# Patient Record
Sex: Female | Born: 1970
Health system: Southern US, Community
[De-identification: ages and names within clinical notes are randomized; demographics above are authoritative.]

## PROBLEM LIST (undated history)

## (undated) DIAGNOSIS — E079 Disorder of thyroid, unspecified: Secondary | ICD-10-CM

## (undated) DIAGNOSIS — F419 Anxiety disorder, unspecified: Secondary | ICD-10-CM

## (undated) DIAGNOSIS — D219 Benign neoplasm of connective and other soft tissue, unspecified: Secondary | ICD-10-CM

## (undated) DIAGNOSIS — F32A Depression, unspecified: Secondary | ICD-10-CM

## (undated) DIAGNOSIS — C801 Malignant (primary) neoplasm, unspecified: Secondary | ICD-10-CM

## (undated) HISTORY — DX: Disorder of thyroid, unspecified: E07.9

## (undated) HISTORY — PX: MOUTH SURGERY: SHX715

## (undated) HISTORY — DX: Anxiety disorder, unspecified: F41.9

## (undated) HISTORY — DX: Depression, unspecified: F32.A

## (undated) HISTORY — DX: Benign neoplasm of connective and other soft tissue, unspecified: D21.9

---

## 2007-04-11 ENCOUNTER — Inpatient Hospital Stay (HOSPITAL_COMMUNITY): Admission: AD | Admit: 2007-04-11 | Discharge: 2007-04-12 | Payer: Self-pay | Admitting: Obstetrics and Gynecology

## 2007-07-25 ENCOUNTER — Ambulatory Visit (HOSPITAL_COMMUNITY): Admission: RE | Admit: 2007-07-25 | Discharge: 2007-07-25 | Payer: Self-pay | Admitting: *Deleted

## 2007-07-25 ENCOUNTER — Encounter (INDEPENDENT_AMBULATORY_CARE_PROVIDER_SITE_OTHER): Payer: Self-pay | Admitting: *Deleted

## 2008-02-17 ENCOUNTER — Emergency Department (HOSPITAL_COMMUNITY): Admission: EM | Admit: 2008-02-17 | Discharge: 2008-02-17 | Payer: Self-pay | Admitting: Emergency Medicine

## 2008-11-27 ENCOUNTER — Telehealth: Payer: Self-pay | Admitting: *Deleted

## 2010-11-23 ENCOUNTER — Other Ambulatory Visit (HOSPITAL_COMMUNITY): Payer: Self-pay | Admitting: Obstetrics and Gynecology

## 2010-11-23 DIAGNOSIS — Z0000 Encounter for general adult medical examination without abnormal findings: Secondary | ICD-10-CM

## 2010-11-30 ENCOUNTER — Ambulatory Visit (HOSPITAL_COMMUNITY)
Admission: RE | Admit: 2010-11-30 | Discharge: 2010-11-30 | Disposition: A | Discharge status: Home / Self Care | Payer: 59 | Source: Ambulatory Visit | Attending: Obstetrics and Gynecology | Admitting: Obstetrics and Gynecology

## 2010-11-30 DIAGNOSIS — Z0000 Encounter for general adult medical examination without abnormal findings: Secondary | ICD-10-CM

## 2010-11-30 DIAGNOSIS — Z1231 Encounter for screening mammogram for malignant neoplasm of breast: Secondary | ICD-10-CM | POA: Insufficient documentation

## 2010-12-05 ENCOUNTER — Other Ambulatory Visit: Payer: Self-pay | Admitting: Obstetrics and Gynecology

## 2010-12-05 DIAGNOSIS — R928 Other abnormal and inconclusive findings on diagnostic imaging of breast: Secondary | ICD-10-CM

## 2010-12-09 ENCOUNTER — Ambulatory Visit
Admission: RE | Admit: 2010-12-09 | Discharge: 2010-12-09 | Disposition: A | Discharge status: Home / Self Care | Payer: 59 | Source: Ambulatory Visit | Attending: Obstetrics and Gynecology | Admitting: Obstetrics and Gynecology

## 2010-12-09 DIAGNOSIS — R928 Other abnormal and inconclusive findings on diagnostic imaging of breast: Secondary | ICD-10-CM

## 2011-03-07 NOTE — Op Note (Signed)
Lisa Morrow, RIEF NO.:  [id number]   MEDICAL RECORD NO.:  [id number]          PATIENT TYPE:  AMB   LOCATION:  SDC                           FACILITY:  [facility]   PHYSICIAN:  Langlade B. Earlene Plater, M.D.  DATE OF BIRTH:  Apr 29, 1971   DATE OF PROCEDURE:  07/25/2007  DATE OF DISCHARGE:                               OPERATIVE REPORT   PREOPERATIVE DIAGNOSES:  1. Menorrhagia.  2. Endometrial polyp.   POSTOPERATIVE DIAGNOSES:  1. Menorrhagia.  2. Endometrial polyp.   PROCEDURE:  Hysteroscopy D&C, polyp removal and NovaSure endometrial  ablation.   SURGEON:  Chester Holstein. Earlene Plater, M.D.   ASSISTANT:  None.   ANESTHESIA:  LMA general and 20 mL 1% lidocaine paracervical block.   FINDINGS:  Proliferative endometrioma and endometrial polyp.   SPECIMEN:  Endometrial tissue to pathology.   BLOOD LOSS:  Minimal.   COMPLICATIONS:  None.   FLUID DEFICIT:  40 mL.   INDICATIONS:  Patient with a history of heavy and irregular menses.  Ultrasound showed a thickened endometrium, saline infusion ultrasound  suggestive of an isolated endometrial polyp.  The patient does not  desire childbearing in the future and requested permanent but minimally  invasive treatment for heavy menses with endometrial ablation.  The  patient was advised and this was re-emphasized at the last preop that  this is not birth control.  She did decline tubal ligation but stated  she would have her partner either use condoms or other method of birth  control.  The patient advised the risks of surgery including infection,  bleeding, damage to surrounding organs.   PROCEDURE:  The patient taken to the operating room and LMA and general  anesthesia obtained.  She is prepped and draped in standard fashion and  bladder emptied with in-and-out cath.   Exam under anesthesia showed anteverted normal size uterus, no adnexal  masses.   Speculum inserted.  Paracervical block placed.  Uterus sounded to 8 cm,  cervical length estimated 4 cm, cervix dilated to a #21.  Diagnostic  hysteroscope inserted after being flushed.  Good distention obtained.  Right tubal ostia appeared normal.  Left tubal ostia poorly visualized.  Two areas consistent with endometrial polyps noted in the cavity,  otherwise no focal masses or other concerning findings.  Endometrium was  then gently curetted and polyps removed with Randall stone forceps.   The cervix then dilated to #31, NovaSure device inserted and array  deployed in standard fashion.  CO2 test performed and passed.  Endometrium was then ablated for 2 minutes.  The power was 70 watts.  The array was retracted, scope reinserted, cavity inspected.  Good  endometrial coverage noted.   The instruments were removed.  Cervix hemostatic. Patient tolerated the  procedure well.  There were no complications.  She was taken to the  recovery room awake, alert in stable condition.      [name] B. Earlene Plater, M.D.  Electronically Signed     WBD/MEDQ  D:  07/25/2007  T:  07/25/2007  Job:  [id number]

## 2011-08-03 LAB — CBC
MCHC: 34
Platelets: 260
RDW: 13.7

## 2011-08-03 LAB — DIFFERENTIAL
Basophils Absolute: 0
Basophils Relative: 0
Neutro Abs: 4.8
Neutrophils Relative %: 60

## 2011-08-09 LAB — URINALYSIS, ROUTINE W REFLEX MICROSCOPIC
Protein, ur: NEGATIVE
Urobilinogen, UA: 1

## 2011-08-09 LAB — GC/CHLAMYDIA PROBE AMP, GENITAL
Chlamydia, DNA Probe: NEGATIVE
GC Probe Amp, Genital: NEGATIVE

## 2011-08-09 LAB — WET PREP, GENITAL: Clue Cells Wet Prep HPF POC: NONE SEEN

## 2011-08-09 LAB — URINE MICROSCOPIC-ADD ON

## 2011-12-12 ENCOUNTER — Other Ambulatory Visit (HOSPITAL_COMMUNITY): Payer: Self-pay | Admitting: Obstetrics and Gynecology

## 2011-12-12 DIAGNOSIS — Z1231 Encounter for screening mammogram for malignant neoplasm of breast: Secondary | ICD-10-CM

## 2012-01-03 ENCOUNTER — Ambulatory Visit (HOSPITAL_COMMUNITY): Payer: 59 | Attending: Obstetrics and Gynecology

## 2013-02-06 ENCOUNTER — Other Ambulatory Visit (HOSPITAL_COMMUNITY): Payer: Self-pay | Admitting: Obstetrics and Gynecology

## 2013-02-06 DIAGNOSIS — Z1231 Encounter for screening mammogram for malignant neoplasm of breast: Secondary | ICD-10-CM

## 2013-02-10 ENCOUNTER — Ambulatory Visit (HOSPITAL_COMMUNITY)
Admission: RE | Admit: 2013-02-10 | Discharge: 2013-02-10 | Disposition: A | Discharge status: Home / Self Care | Payer: 59 | Source: Ambulatory Visit | Attending: Obstetrics and Gynecology | Admitting: Obstetrics and Gynecology

## 2013-02-10 DIAGNOSIS — Z1231 Encounter for screening mammogram for malignant neoplasm of breast: Secondary | ICD-10-CM | POA: Insufficient documentation

## 2014-01-02 ENCOUNTER — Other Ambulatory Visit (HOSPITAL_COMMUNITY): Payer: Self-pay | Admitting: Obstetrics and Gynecology

## 2014-01-02 DIAGNOSIS — Z1231 Encounter for screening mammogram for malignant neoplasm of breast: Secondary | ICD-10-CM

## 2014-02-11 ENCOUNTER — Ambulatory Visit (HOSPITAL_COMMUNITY): Payer: 59

## 2014-02-12 ENCOUNTER — Ambulatory Visit (HOSPITAL_COMMUNITY)
Admission: RE | Admit: 2014-02-12 | Discharge: 2014-02-12 | Disposition: A | Discharge status: Home / Self Care | Payer: 59 | Source: Ambulatory Visit | Attending: Obstetrics and Gynecology | Admitting: Obstetrics and Gynecology

## 2014-02-12 DIAGNOSIS — Z1231 Encounter for screening mammogram for malignant neoplasm of breast: Secondary | ICD-10-CM | POA: Insufficient documentation

## 2015-11-23 DIAGNOSIS — N949 Unspecified condition associated with female genital organs and menstrual cycle: Secondary | ICD-10-CM | POA: Diagnosis not present

## 2016-01-11 DIAGNOSIS — Z6823 Body mass index (BMI) 23.0-23.9, adult: Secondary | ICD-10-CM | POA: Diagnosis not present

## 2016-01-11 DIAGNOSIS — Z01419 Encounter for gynecological examination (general) (routine) without abnormal findings: Secondary | ICD-10-CM | POA: Diagnosis not present

## 2016-01-11 DIAGNOSIS — Z1231 Encounter for screening mammogram for malignant neoplasm of breast: Secondary | ICD-10-CM | POA: Diagnosis not present

## 2016-01-11 DIAGNOSIS — Z113 Encounter for screening for infections with a predominantly sexual mode of transmission: Secondary | ICD-10-CM | POA: Diagnosis not present

## 2016-05-04 DIAGNOSIS — M62830 Muscle spasm of back: Secondary | ICD-10-CM | POA: Diagnosis not present

## 2016-05-04 DIAGNOSIS — Z719 Counseling, unspecified: Secondary | ICD-10-CM | POA: Diagnosis not present

## 2017-02-20 DIAGNOSIS — Z6826 Body mass index (BMI) 26.0-26.9, adult: Secondary | ICD-10-CM | POA: Diagnosis not present

## 2017-02-20 DIAGNOSIS — Z1231 Encounter for screening mammogram for malignant neoplasm of breast: Secondary | ICD-10-CM | POA: Diagnosis not present

## 2017-02-20 DIAGNOSIS — Z1389 Encounter for screening for other disorder: Secondary | ICD-10-CM | POA: Diagnosis not present

## 2017-02-20 DIAGNOSIS — Z01419 Encounter for gynecological examination (general) (routine) without abnormal findings: Secondary | ICD-10-CM | POA: Diagnosis not present

## 2017-02-21 MED FILL — VIT D2 1.25 MG (50,000 UNIT: 1.25 MG | 28 days supply | Qty: 4 | Fill #0

## 2017-07-07 DIAGNOSIS — T7840XA Allergy, unspecified, initial encounter: Secondary | ICD-10-CM | POA: Diagnosis not present

## 2017-11-06 DIAGNOSIS — R102 Pelvic and perineal pain: Secondary | ICD-10-CM | POA: Diagnosis not present

## 2017-11-06 MED FILL — traMADol HCL 50 MG TABS: 50 | 4 days supply | Qty: 30 | Fill #0

## 2017-11-07 MED FILL — HYDROCODON-APAP 5-325: 5-325 | 5 days supply | Qty: 30 | Fill #0

## 2020-06-03 ENCOUNTER — Other Ambulatory Visit: Payer: Self-pay | Admitting: Obstetrics and Gynecology

## 2020-06-03 DIAGNOSIS — Z1231 Encounter for screening mammogram for malignant neoplasm of breast: Secondary | ICD-10-CM

## 2020-06-17 ENCOUNTER — Ambulatory Visit
Admission: RE | Admit: 2020-06-17 | Discharge: 2020-06-17 | Disposition: A | Discharge status: Home / Self Care | Payer: 59 | Source: Ambulatory Visit | Attending: Obstetrics and Gynecology | Admitting: Obstetrics and Gynecology

## 2020-06-17 ENCOUNTER — Other Ambulatory Visit: Payer: Self-pay

## 2020-06-17 DIAGNOSIS — Z1231 Encounter for screening mammogram for malignant neoplasm of breast: Secondary | ICD-10-CM

## 2020-07-21 ENCOUNTER — Other Ambulatory Visit: Payer: Self-pay

## 2020-07-21 ENCOUNTER — Encounter: Payer: Self-pay | Admitting: Family

## 2020-07-21 ENCOUNTER — Ambulatory Visit: Payer: 59 | Admitting: Family

## 2020-07-21 ENCOUNTER — Encounter: Payer: Self-pay | Admitting: Gastroenterology

## 2020-07-21 VITALS — BP 118/72 | HR 81 | Temp 98.7°F | Ht 68.0 in | Wt 181.0 lb

## 2020-07-21 DIAGNOSIS — Z13220 Encounter for screening for lipoid disorders: Secondary | ICD-10-CM

## 2020-07-21 DIAGNOSIS — E559 Vitamin D deficiency, unspecified: Secondary | ICD-10-CM | POA: Diagnosis not present

## 2020-07-21 DIAGNOSIS — F418 Other specified anxiety disorders: Secondary | ICD-10-CM | POA: Insufficient documentation

## 2020-07-21 DIAGNOSIS — Z1211 Encounter for screening for malignant neoplasm of colon: Secondary | ICD-10-CM | POA: Diagnosis not present

## 2020-07-21 DIAGNOSIS — D259 Leiomyoma of uterus, unspecified: Secondary | ICD-10-CM | POA: Insufficient documentation

## 2020-07-21 DIAGNOSIS — Z0000 Encounter for general adult medical examination without abnormal findings: Secondary | ICD-10-CM

## 2020-07-21 MED ORDER — BUSPIRONE HCL 7.5 MG PO TABS: 7.5000 mg | ORAL_TABLET | Freq: Three times a day (TID) | ORAL | 0 refills | Status: DC

## 2020-07-21 MED FILL — busPIRone HCL 7.5 MG TABS: 7.5 | 30 days supply | Qty: 90 | Fill #0

## 2020-07-21 NOTE — Progress Notes (Signed)
Lisa Morrow is a 49 y.o. female with the following history as recorded in EpicCare:  Patient Active Problem List   Diagnosis Date Noted  . Fibroid uterus 07/21/2020  . Vitamin D deficiency 07/21/2020  . Situational anxiety 07/21/2020    Current Outpatient Medications  Medication Sig Dispense Refill  . ibuprofen (IBU) 800 MG tablet Take 800 mg by mouth 2 (two) times daily at 10 AM and 5 PM.    . OREGANO PO Take by mouth.    . busPIRone (BUSPAR) 7.5 MG tablet Take 1 tablet (7.5 mg total) by mouth 3 (three) times daily. 90 tablet 0   No current facility-administered medications for this visit.    Allergies: Patient has no known allergies.  No past medical history on file.  History reviewed. No pertinent surgical history.  Family History  Problem Relation Age of Onset  . Breast cancer Maternal Aunt     Social History   Tobacco Use  . Smoking status: Not on file  Substance Use Topics  . Alcohol use: Not on file    Subjective:  Patient presents today as a new patient; needs to establish with primary care; scheduled with GYN- planning to discuss hysterectomy'; Does have some situational anxiety- does not want to take daily medication at this time; would like to try something to use as needed;  Will plan for Tdap at later date;   Review of Systems  Constitutional: Negative.   HENT: Negative.   Eyes: Negative.   Respiratory: Negative.   Cardiovascular: Negative.   Gastrointestinal: Negative.   Genitourinary: Negative.   Musculoskeletal: Negative.   Skin: Negative.   Neurological: Negative.   Endo/Heme/Allergies: Negative.   Psychiatric/Behavioral: The patient is nervous/anxious and has insomnia.       Objective:  Vitals:   07/21/20 0929  BP: 118/72  Pulse: 81  Temp: 98.7 F (37.1 C)  TempSrc: Oral  SpO2: 99%  Weight: 181 lb (82.1 kg)  Height: $Remove'5\' 8"'OGOcpLR$  (1.727 m)    General: Well developed, well nourished, in no acute distress  Skin : Warm and dry.  Head:  Normocephalic and atraumatic  Eyes: Sclera and conjunctiva clear; pupils round and reactive to light; extraocular movements intact  Ears: External normal; canals clear; tympanic membranes normal  Oropharynx: Pink, supple. No suspicious lesions  Neck: Supple without thyromegaly, adenopathy  Lungs: Respirations unlabored; clear to auscultation bilaterally without wheeze, rales, rhonchi  CVS exam: normal rate and regular rhythm.  Abdomen: Soft; nontender; nondistended; normoactive bowel sounds; no masses or hepatosplenomegaly  Musculoskeletal: No deformities; no active joint inflammation  Extremities: No edema, cyanosis, clubbing  Vessels: Symmetric bilaterally  Neurologic: Alert and oriented; speech intact; face symmetrical; moves all extremities well; CNII-XII intact without focal deficit   Assessment:  1. PE (physical exam), annual   2. Vitamin D deficiency   3. Situational anxiety   4. Encounter for screening colonoscopy   5. Lipid screening     Plan:  Age appropriate preventive healthcare needs addressed; encouraged regular eye doctor and dental exams; encouraged regular exercise; will update labs and refills as needed today; follow-up to be determined;  Plan for Tdap at later date; she will call back with response to Buspar; may need to consider daily medication;  This visit occurred during the SARS-CoV-2 public health emergency.  Safety protocols were in place, including screening questions prior to the visit, additional usage of staff PPE, and extensive cleaning of exam room while observing appropriate contact time as indicated for disinfecting  solutions.      No follow-ups on file.  Orders Placed This Encounter  Procedures  . CBC with Differential/Platelet    Standing Status:   Future    Number of Occurrences:   1    Standing Expiration Date:   07/21/2021  . Comp Met (CMET)    Standing Status:   Future    Number of Occurrences:   1    Standing Expiration Date:   07/21/2021   . Lipid panel    Standing Status:   Future    Number of Occurrences:   1    Standing Expiration Date:   07/21/2021  . TSH    Standing Status:   Future    Number of Occurrences:   1    Standing Expiration Date:   07/21/2021  . Vitamin D (25 hydroxy)    Standing Status:   Future    Number of Occurrences:   1    Standing Expiration Date:   07/21/2021  . Ambulatory referral to Gastroenterology    Referral Priority:   Routine    Referral Type:   Consultation    Referral Reason:   Specialty Services Required    Number of Visits Requested:   1    Requested Prescriptions   Signed Prescriptions Disp Refills  . busPIRone (BUSPAR) 7.5 MG tablet 90 tablet 0    Sig: Take 1 tablet (7.5 mg total) by mouth 3 (three) times daily.

## 2020-07-22 LAB — LIPID PANEL
Cholesterol: 155 mg/dL (ref <200)
HDL: 61 mg/dL (ref >=50)
LDL Cholesterol (Calc): 81 mg/dL (calc)
Non-HDL Cholesterol (Calc): 94 mg/dL (calc) (ref <130)
Total CHOL/HDL Ratio: 2.5 (calc) (ref <5.0)
Triglycerides: 47 mg/dL (ref <150)

## 2020-07-22 LAB — VITAMIN D 25 HYDROXY (VIT D DEFICIENCY, FRACTURES): Vit D, 25-Hydroxy: 6 ng/mL — ABNORMAL LOW (ref 30–100)

## 2020-07-22 LAB — CBC WITH DIFFERENTIAL/PLATELET
Absolute Monocytes: 409 cells/uL (ref 200–950)
Basophils Absolute: 27 cells/uL (ref 0–200)
Basophils Relative: 0.4 %
Eosinophils Absolute: 60 cells/uL (ref 15–500)
Eosinophils Relative: 0.9 %
HCT: 43.4 % (ref 35.0–45.0)
Hemoglobin: 14.6 g/dL (ref 11.7–15.5)
Lymphs Abs: 2090 cells/uL (ref 850–3900)
MCH: 31.5 pg (ref 27.0–33.0)
MCHC: 33.6 g/dL (ref 32.0–36.0)
MCV: 93.7 fL (ref 80.0–100.0)
MPV: 9.4 fL (ref 7.5–12.5)
Monocytes Relative: 6.1 %
Neutro Abs: 4114 cells/uL (ref 1500–7800)
Neutrophils Relative %: 61.4 %
Platelets: 236 10*3/uL (ref 140–400)
RBC: 4.63 10*6/uL (ref 3.80–5.10)
RDW: 12.4 % (ref 11.0–15.0)
Total Lymphocyte: 31.2 %
WBC: 6.7 10*3/uL (ref 3.8–10.8)

## 2020-07-22 LAB — COMPREHENSIVE METABOLIC PANEL
AG Ratio: 1.9 (calc) (ref 1.0–2.5)
ALT: 10 U/L (ref 6–29)
AST: 13 U/L (ref 10–35)
Albumin: 4.3 g/dL (ref 3.6–5.1)
Alkaline phosphatase (APISO): 59 U/L (ref 31–125)
BUN: 17 mg/dL (ref 7–25)
CO2: 26 mmol/L (ref 20–32)
Calcium: 9.1 mg/dL (ref 8.6–10.2)
Chloride: 106 mmol/L (ref 98–110)
Creat: 0.73 mg/dL (ref 0.50–1.10)
Globulin: 2.3 g/dL (calc) (ref 1.9–3.7)
Glucose, Bld: 91 mg/dL (ref 65–99)
Potassium: 3.8 mmol/L (ref 3.5–5.3)
Sodium: 139 mmol/L (ref 135–146)
Total Bilirubin: 0.6 mg/dL (ref 0.2–1.2)
Total Protein: 6.6 g/dL (ref 6.1–8.1)

## 2020-07-22 LAB — TSH: TSH: 0.55 mIU/L

## 2020-07-23 ENCOUNTER — Other Ambulatory Visit: Payer: Self-pay | Admitting: Family

## 2020-07-23 MED ORDER — VITAMIN D (ERGOCALCIFEROL) 1.25 MG (50000 UNIT) PO CAPS: 50000.0000 [IU] | ORAL_CAPSULE | ORAL | 0 refills | Status: DC

## 2020-07-23 MED FILL — VIT D2 1.25 MG (50,000 UNIT: 1.25 MG | 84 days supply | Qty: 12 | Fill #0

## 2020-09-08 ENCOUNTER — Encounter: Payer: Self-pay | Admitting: Family

## 2020-09-24 ENCOUNTER — Encounter: Payer: 59 | Admitting: Gastroenterology

## 2020-10-08 ENCOUNTER — Encounter: Payer: Self-pay | Admitting: Family

## 2020-10-12 ENCOUNTER — Encounter: Payer: Self-pay | Admitting: Family

## 2020-10-12 ENCOUNTER — Ambulatory Visit: Payer: 59 | Admitting: Family

## 2020-10-12 ENCOUNTER — Encounter: Payer: Self-pay | Admitting: Family Medicine

## 2020-10-12 ENCOUNTER — Ambulatory Visit (INDEPENDENT_AMBULATORY_CARE_PROVIDER_SITE_OTHER): Payer: 59 | Admitting: Family Medicine

## 2020-10-12 ENCOUNTER — Other Ambulatory Visit: Payer: Self-pay

## 2020-10-12 ENCOUNTER — Ambulatory Visit (HOSPITAL_COMMUNITY)
Admission: RE | Admit: 2020-10-12 | Discharge: 2020-10-12 | Disposition: A | Discharge status: Home / Self Care | Payer: 59 | Source: Ambulatory Visit | Attending: Family Medicine | Admitting: Family Medicine

## 2020-10-12 ENCOUNTER — Ambulatory Visit: Payer: Self-pay

## 2020-10-12 VITALS — BP 124/82 | HR 78 | Temp 98.3°F | Ht 68.0 in | Wt 184.0 lb

## 2020-10-12 VITALS — BP 124/82 | HR 78 | Ht 68.0 in | Wt 184.0 lb

## 2020-10-12 DIAGNOSIS — M79662 Pain in left lower leg: Secondary | ICD-10-CM

## 2020-10-12 DIAGNOSIS — E559 Vitamin D deficiency, unspecified: Secondary | ICD-10-CM

## 2020-10-12 LAB — VITAMIN D 25 HYDROXY (VIT D DEFICIENCY, FRACTURES): VITD: 27.66 ng/mL — ABNORMAL LOW (ref 30.00–100.00)

## 2020-10-12 NOTE — Progress Notes (Signed)
°  Lisa Morrow is a 49 y.o. female with the following history as recorded in EpicCare:  Patient Active Problem List   Diagnosis Date Noted   Fibroid uterus 07/21/2020   Vitamin D deficiency 07/21/2020   Situational anxiety 07/21/2020    Current Outpatient Medications  Medication Sig Dispense Refill   ibuprofen (ADVIL) 800 MG tablet Take 800 mg by mouth 2 (two) times daily at 10 AM and 5 PM.     OREGANO PO Take by mouth.     busPIRone (BUSPAR) 7.5 MG tablet Take 1 tablet (7.5 mg total) by mouth 3 (three) times daily. (Patient not taking: No sig reported) 90 tablet 0   No current facility-administered medications for this visit.    Allergies: Patient has no known allergies.  History reviewed. No pertinent past medical history.  History reviewed. No pertinent surgical history.  Family History  Problem Relation Age of Onset   Breast cancer Maternal Aunt     Social History   Tobacco Use   Smoking status: Current Every Day Smoker    Start date: 10/23/1994   Smokeless tobacco: Never Used  Substance Use Topics   Alcohol use: Not on file    Subjective:  Intermittent left calf pain x 2 weeks; seemed to start after a "big stretch"; no specific swelling noted in the leg; does feel better with Icy Hot/ rest;   Also due to get her Vitamin D level re-checked today;    Objective:  Vitals:   10/12/20 0922  BP: 124/82  Pulse: 78  Temp: 98.3 F (36.8 C)  TempSrc: Oral  SpO2: 98%  Weight: 184 lb (83.5 kg)  Height: 5\' 8"  (1.727 m)    General: Well developed, well nourished, in no acute distress  Skin : Warm and dry.  Head: Normocephalic and atraumatic  Lungs: Respirations unlabored;  Musculoskeletal: No deformities; no active joint inflammation  Extremities: No edema, cyanosis, clubbing  Vessels: Symmetric bilaterally  Neurologic: Alert and oriented; speech intact; face symmetrical; moves all extremities well; CNII-XII intact without focal deficit   Assessment:  1.  Vitamin D deficiency   2. Pain of left calf     Plan:  1. Check Vitamin D level today; 2. Low suspicion for DVT- more concerning for possible tear; she is referred to our sports medicine provider today for immediate evaluation; follow up to be determined- if that exam is normal, will consider referral for vascular ultrasound;  This visit occurred during the SARS-CoV-2 public health emergency.  Safety protocols were in place, including screening questions prior to the visit, additional usage of staff PPE, and extensive cleaning of exam room while observing appropriate contact time as indicated for disinfecting solutions.     No follow-ups on file.  Orders Placed This Encounter  Procedures   Vitamin D (25 hydroxy)    Standing Status:   Future    Number of Occurrences:   1    Standing Expiration Date:   10/12/2021    Requested Prescriptions    No prescriptions requested or ordered in this encounter

## 2020-10-12 NOTE — Progress Notes (Signed)
    Subjective:    CC: L calf pain  I, Lisa Morrow, LAT, ATC, am serving as scribe for Dr. Lynne Leader.  HPI: Pt is a 49 y/o female presenting w/ L calf pain x approximately 2 weeks after trying to stretch in bed when she felt a pop/tearing sensation in her L calf.  She locates her pain to her L proximal-lateral calf .  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    Radiating pain: yes into the distal calf L calf swelling: yes Aggravating factors: walking; weight-bearing Treatments tried: IBU, heat; topical pain reliever (Tiger Balm)  Pertinent review of Systems: No fevers or chills  Relevant historical information: Situational anxiety.  Fibroid uterus.  No history of DVT.   Objective:    Vitals:   10/12/20 1100  BP: 124/82  Pulse: 78  SpO2: 98%   General: Well Developed, well nourished, and in no acute distress.   MSK: Calf slightly enlarged compared to right.  Mildly tender palpation proximal lateral calf near fibular head Normal knee motion.  No pain with resisted knee flexion.  Normal foot and ankle motion no significant pain with resisted ankle dorsiflexion. Pulses cap refill and sensation are intact distally.  Lab and Radiology Results  Diagnostic Limited MSK Ultrasound of: Left calf Proximal posterior lateral calf area of tenderness visualized.  No significant abnormalities visualized. Impression: Unclear cause of pain    Impression and Recommendations:    Assessment and Plan: 49 y.o. female with left calf pain ongoing for about a week and a half.  No clear cause of pain however most likely explanation is calf strain.  DVT is a possibility.  We will proceed with vascular ultrasound to evaluate for DVT.  Additionally treat for calf strain with home exercise program Voltaren gel and compressive knee sleeve.  Check back if not improving in a month and  return sooner if needed.Marland Kitchen  PDMP not reviewed this encounter. Orders Placed This Encounter  Procedures  . [place] LIMITED JOINT SPACE STRUCTURES LOW LEFT(NO LINKED CHARGES)    Order Specific Question:   Reason for Exam (SYMPTOM  OR DIAGNOSIS REQUIRED)    Answer:   L calf pain    Order Specific Question:   Preferred imaging location?    Answer:   [place]   No orders of the defined types were placed in this encounter.   Discussed warning signs or symptoms. Please see discharge instructions. Patient expresses understanding.   The above documentation has been reviewed and is accurate and complete Lynne Leader, M.D.

## 2020-10-12 NOTE — Patient Instructions (Addendum)
  Thank you for coming in today.  Do the exercises reviewed. Go from up to down slowly.  30 reps 2-3x daily if you can.   I recommend you obtained a compression sleeve to help with your joint problems. There are many options on the market however I recommend obtaining a full knee Body Helix compression sleeve.  You can find information (including how to appropriate measure yourself for sizing) can be found at www.Body http://www.lambert.com/.  Many of these products are health savings account (HSA) eligible.   You can use the compression sleeve at any time throughout the day but is most important to use while being active as well as for 2 hours post-activity.   It is appropriate to ice following activity with the compression sleeve in place.  Please use voltaren gel up to 4x daily for pain as needed.   Heat is ok.    Recheck in 4 weeks or sooner if not better.

## 2020-10-13 NOTE — Progress Notes (Signed)
You do not have a deep vein blood clot but you do have a superficial vein clot.  This is probably the source of pain.  This is not dangerous like a deep vein clot is.  Recommend using compression and the Voltaren gel.  Check back with me in about 2 weeks.  You do not need to do the stretching exercises that we reviewed.  Your diagnosis is superficial venous thrombophlebitis.

## 2020-10-25 NOTE — Progress Notes (Signed)
° °  I, Christoper Fabian, LAT, ATC, am serving as scribe for Dr. Clementeen Graham.  TOBI Lisa Morrow is a 50 y.o. female who presents to [facility] today for f/u of L proximal, lateral calf pain.  She was last seen by Dr. Denyse Amass on 10/12/20 and was referred for a vascular doppler US that was neg for DVT but did show evidence of a superficial vein clot.  She was advised to purchase a compression sleeve for her calf.  Since her last visit, pt reports starting feeling improvement after 12/25. Pt has been compliant w/ wearing the body helix compression sleeve, Voltaren gel, elevation, and heating pad. Pt reports she no longer has that "pulling" sensation. Pt reports feeling 100% better.  Diagnostic testing: L LE venous doppler US- 10/12/20   Pertinent review of systems: No fevers or chills  Relevant historical information: Fibroid uterus and vitamin D deficiency.  Works at the cancer center.   Exam:  BP 126/78 (BP Location: Right Arm, Patient Position: Sitting, Cuff Size: Normal)    Pulse 72    Ht 5\' 8"  (1.727 m)    Wt 187 lb (84.8 kg)    SpO2 98%    BMI 28.43 kg/m  General: Well Developed, well nourished, and in no acute distress.   MSK: Calf normal motion normal gait.    Lab and Radiology Results  Summary of vascular ultrasound dated October 12, 2020. Summary:  RIGHT:  - No evidence of deep vein thrombosis in the lower extremity. No indirect  evidence of obstruction proximal to the inguinal ligament.    LEFT:  - Findings consistent with chronic superficial vein thrombosis involving  the left small saphenous vein.  - There is no evidence of deep vein thrombosis in the lower extremity.    - No cystic structure found in the popliteal fossa.    *See table(s) above for measurements and observations.   Electronically signed by October 14, 2020 MD on 10/12/2020 at 5:00:00 PM.     Assessment and Plan: 50 y.o. female with calf pain due to superficial venous  thrombophlebitis.  Patient had excellent response to treatment with compression and topical and oral NSAIDs.  At this point she has been able to discontinue the majority of her treatment including compression and most of the NSAIDs without having any pain.  She feels great without things are going and is delighted with her outcome.  Plan for watchful waiting recheck back as needed.  Precautions reviewed.    Discussed warning signs or symptoms. Please see discharge instructions. Patient expresses understanding.   The above documentation has been reviewed and is accurate and complete 44, M.D.  Total encounter time 20 minutes including face-to-face time with the patient and, reviewing past medical record, and charting on the date of service.   Discussion of diagnosis and treatment plan and precautions

## 2020-10-26 ENCOUNTER — Ambulatory Visit (INDEPENDENT_AMBULATORY_CARE_PROVIDER_SITE_OTHER): Payer: 59 | Admitting: Family Medicine

## 2020-10-26 ENCOUNTER — Other Ambulatory Visit: Payer: Self-pay

## 2020-10-26 VITALS — BP 126/78 | HR 72 | Ht 68.0 in | Wt 187.0 lb

## 2020-10-26 DIAGNOSIS — I8002 Phlebitis and thrombophlebitis of superficial vessels of left lower extremity: Secondary | ICD-10-CM

## 2020-10-26 NOTE — Patient Instructions (Signed)
Thank you for coming in today.  Ok to use compression and the voltaren gel as needed at this point.   I do not think your leg will require special treatment.   Recheck with me as needed for this or other MSK related issues.    Thrombophlebitis Thrombophlebitis is a condition in which a blood clot forms in a vein. This can happen in your arms or legs, or in the area between your neck and groin (torso). When this condition happens in a vein that is close to the surface of the body (superficial thrombophlebitis), it is usually not serious.However, when the condition happens in a vein that is deep inside the body (deep vein thrombosis, DVT), it can cause serious problems. What are the causes? This condition may be caused by:  Damage to a vein.  Inflammation of the veins.  A condition that causes blood to clot more easily.  Reduced blood flow through the veins. What increases the risk? The following factors may make you more likely to develop this condition:  Having a condition that makes blood thicker or more likely to clot.  Having an infection.  Having major surgery.  Experiencing a traumatic injury or a broken bone.  Having a catheter in a vein (central line).  Having a condition in which valves in the veins do not work properly, causing blood to collect (pool) in the veins (chronic venous insufficiency).  An inactive (sedentary) lifestyle.  Pregnancy or having recently given birth.  Cancer.  Older age, especially being 55 or older.  Obesity.  Smoking.  Taking medicines that contain estrogen, such as birth control pills.  Having varicose veins.  Using drugs that are injected into the veins (intravenous, IV). What are the signs or symptoms? The main symptoms of this condition are:  Swelling and pain in an arm or leg. If the affected vein is in the leg, you may feel pain while standing or walking.  Warmth or redness in an arm or leg. Other symptoms  include:  Low-grade fever.  Muscle aches.  A bulging vein (venous distension). In some cases, there are no symptoms. How is this diagnosed? This condition may be diagnosed based on:  Your symptoms and medical history.  A physical exam.  Tests, such as: ? Blood tests. ? A test that uses sound waves to make images (ultrasound). How is this treated? Treatment depends on how severe the condition is and which area of the body is affected. Treatment may include:  Applying a warm compress or heating pad to affected areas.  Wearing compression stockings to help prevent blood clots and reduce swelling in your legs.  Raising (elevating) the affected arm or leg above the level of your heart.  Medicines, such as: ? Anti-inflammatory medicines, such as ibuprofen. ? Blood thinners (anticoagulants), such as heparin. ? Antibiotic medicine, if you have an infection.  Removing an IV that may be causing the problem. In rare cases, surgery may be needed to:  Remove a damaged section of a vein.  Place a filter in a large vein to catch blood clots before they reach the lungs. Follow these instructions at home: Medicines  Take over-the-counter and prescription medicines only as told by your health care provider.  If you were prescribed an antibiotic, take it as told by your health care provider. Do not stop using the antibiotic even if you feel better. Managing pain, stiffness, and swelling   If directed, put heat on the affected area as often as  told by your health care provider. Use the heat source that your health care provider recommends, such as a moist heat pack or a heating pad. ? Place a towel between your skin and the heat source. ? Leave the heat on for 20-30 minutes. ? Remove the heat if your skin turns bright red. This is especially important if you are not able to feel pain, heat, or cold. You may have a greater risk of getting burned.  Elevate the affected area above the  level of your heart while you are sitting or lying down. Activity  Return to your normal activities as told by your health care provider. Ask your health care provider what activities are safe for you.  Avoid sitting or lying down for long periods. If possible, stand up and walk around regularly. If you are taking blood thinners:  Take your medicine exactly as told, at the same time every day.  Avoid activities that could cause injury or bruising, and follow instructions about how to prevent falls.  Wear a medical alert bracelet or carry a card that lists what medicines you take. General instructions  Drink enough fluid to keep your urine pale yellow.  Wear compression stockings as told by your health care provider.  Do not use any products that contain nicotine or tobacco, such as cigarettes and e-cigarettes. If you need help quitting, ask your health care provider.  Keep all follow-up visits as told by your health care provider. This is important. Contact a health care provider if:  You miss a dose of your blood thinner, if applicable.  Your symptoms do not improve.  You have unusual bruising.  You have nausea, vomiting, or diarrhea that lasts for more than one day. Get help right away if:  You have any of these problems: ? New or worse pain, swelling, or redness in an arm or leg. ? Numbness or tingling in an arm or leg. ? Shortness of breath. ? Chest pain. ? Severe pain in your abdomen. ? Fast breathing. ? A fast or irregular heartbeat. ? Blood in your vomit, stool, or urine. ? A severe headache or confusion. ? A cut that does not stop bleeding.  You feel light-headed or dizzy.  You cough up blood.  You have a serious fall or accident, or you hit your head. These symptoms may represent a serious problem that is an emergency. Do not wait to see if the symptoms will go away. Get medical help right away. Call your local emergency services (911 in the [place] Do not  drive yourself to the hospital. Summary  Thrombophlebitis is a condition in which a blood clot forms in a vein. This can happen in a vein close to the surface of the body or a vein deep inside the body.  This condition can cause serious problems when it happens in a vein deep inside the body (deep vein thrombosis, DVT).  The main symptom of this condition is swelling and pain around the affected vein.  Treatment may include warm compresses, anti-inflammatory medicines, or blood thinners. This information is not intended to replace advice given to you by your health care provider. Make sure you discuss any questions you have with your health care provider. Document Revised: 01/29/2019 Document Reviewed: 04/04/2017 Elsevier Patient Education  2020 [employer].

## 2021-03-11 DIAGNOSIS — M79674 Pain in right toe(s): Secondary | ICD-10-CM | POA: Diagnosis not present

## 2021-03-11 DIAGNOSIS — B351 Tinea unguium: Secondary | ICD-10-CM | POA: Diagnosis not present

## 2021-03-11 DIAGNOSIS — B070 Plantar wart: Secondary | ICD-10-CM | POA: Diagnosis not present

## 2021-03-11 DIAGNOSIS — M79675 Pain in left toe(s): Secondary | ICD-10-CM | POA: Diagnosis not present

## 2021-03-25 DIAGNOSIS — L03115 Cellulitis of right lower limb: Secondary | ICD-10-CM | POA: Diagnosis not present

## 2021-03-25 DIAGNOSIS — M7989 Other specified soft tissue disorders: Secondary | ICD-10-CM | POA: Diagnosis not present

## 2021-05-15 IMAGING — MG DIGITAL SCREENING BILAT W/ TOMO W/ CAD
8 series · 8 of 24 positions shown · non-contrast
Comparison: Previous exam(s).

CLINICAL DATA: Screening.

EXAM:
DIGITAL SCREENING BILATERAL MAMMOGRAM WITH TOMO AND CAD

[L MLO synth-2D]
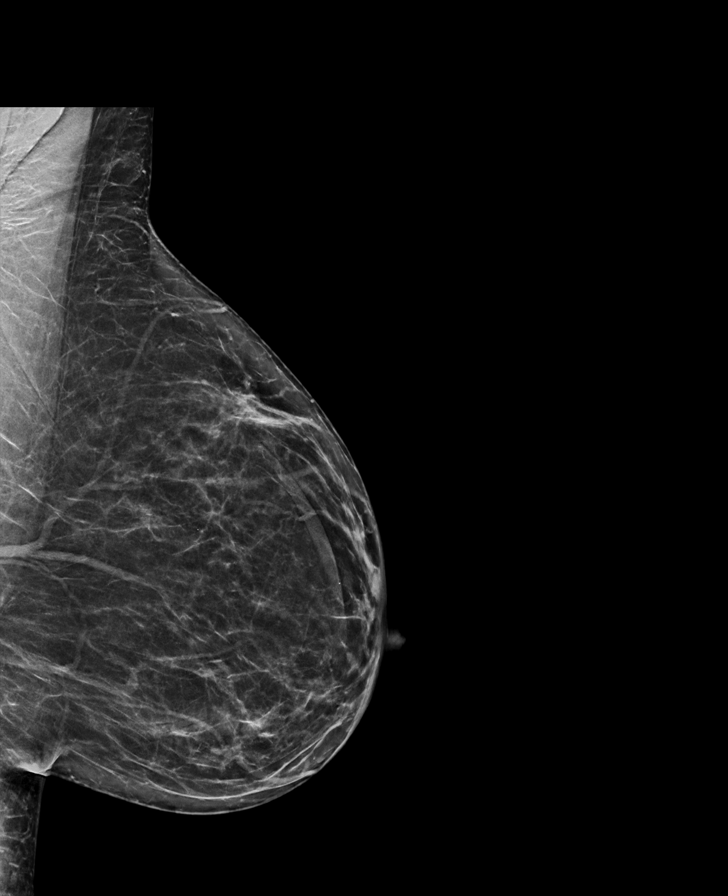

[R CC synth-2D]
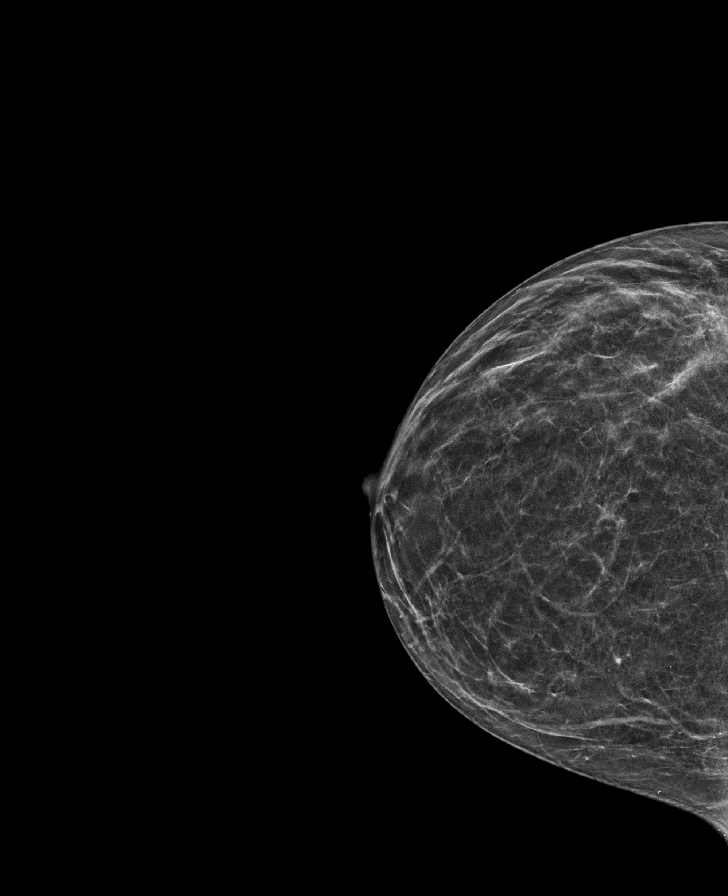

[R MLO synth-2D]
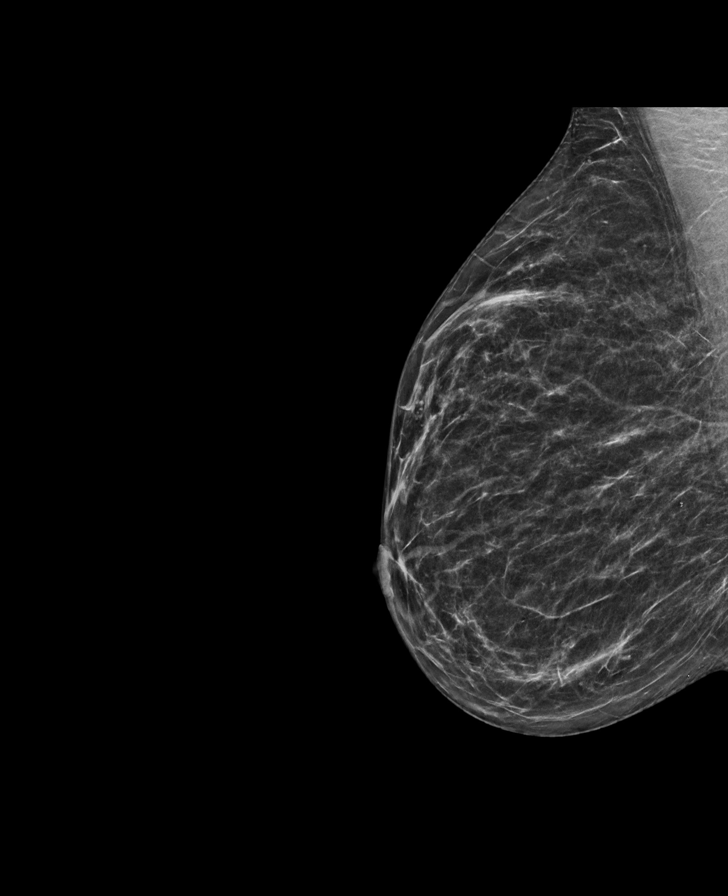

[L CC synth-2D]
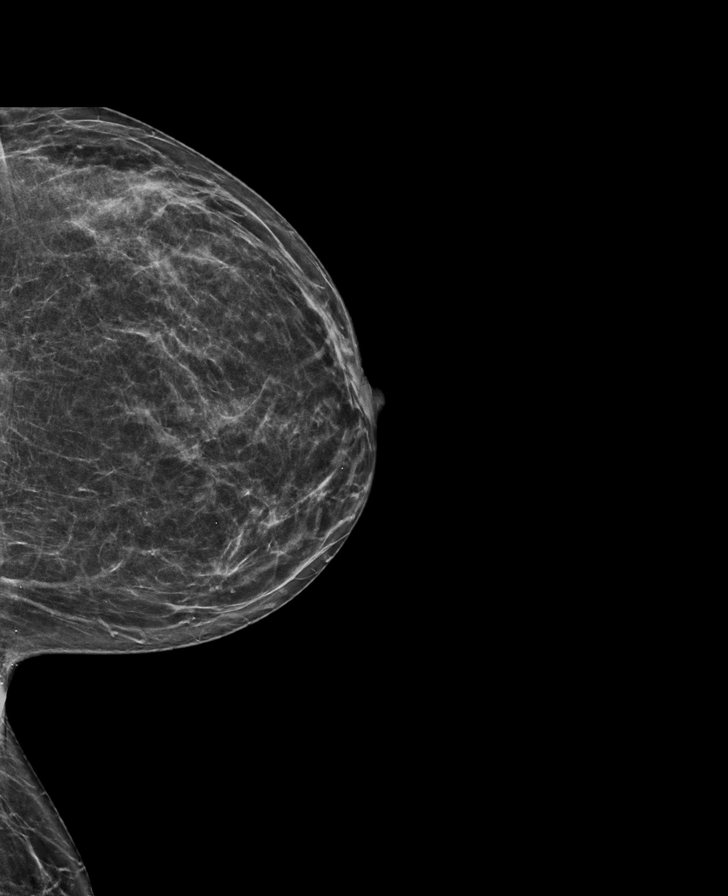

[R CC tomo · tomo slice 29/56.0]
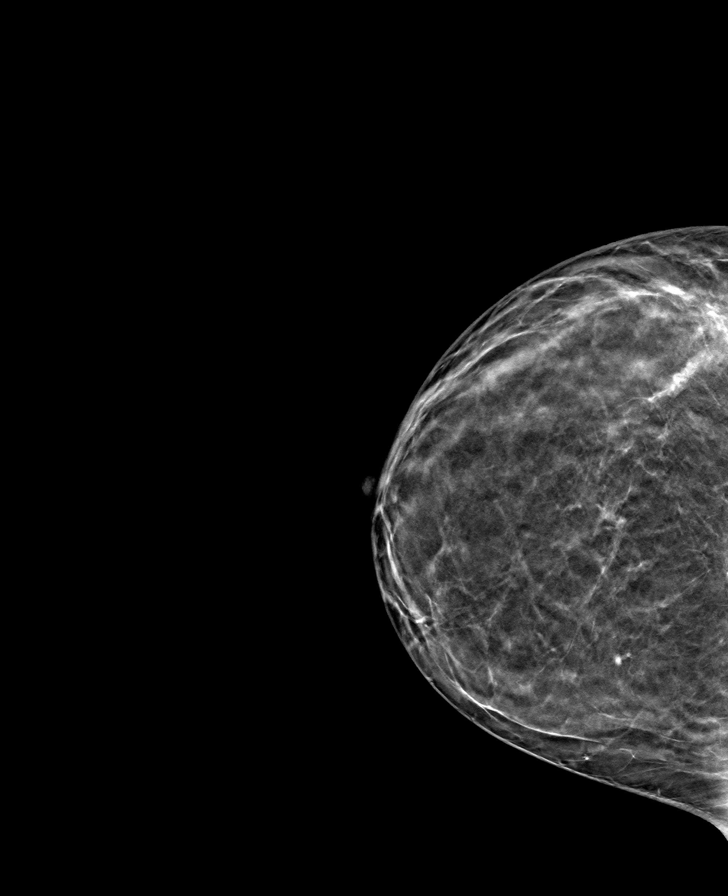

[L CC tomo · tomo slice 31/60.0]
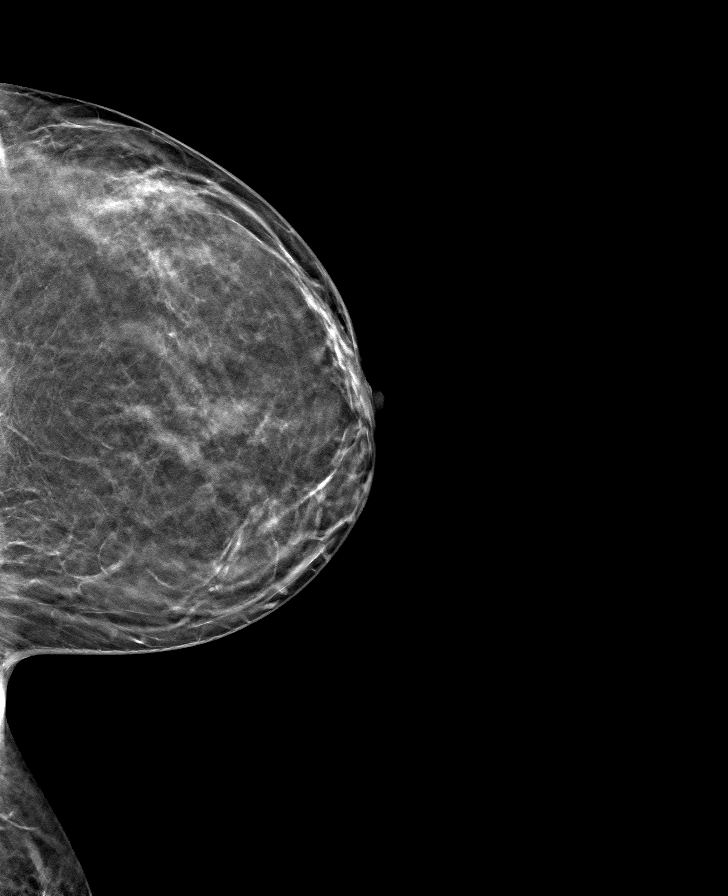

[L MLO tomo · tomo slice 35/68.0]
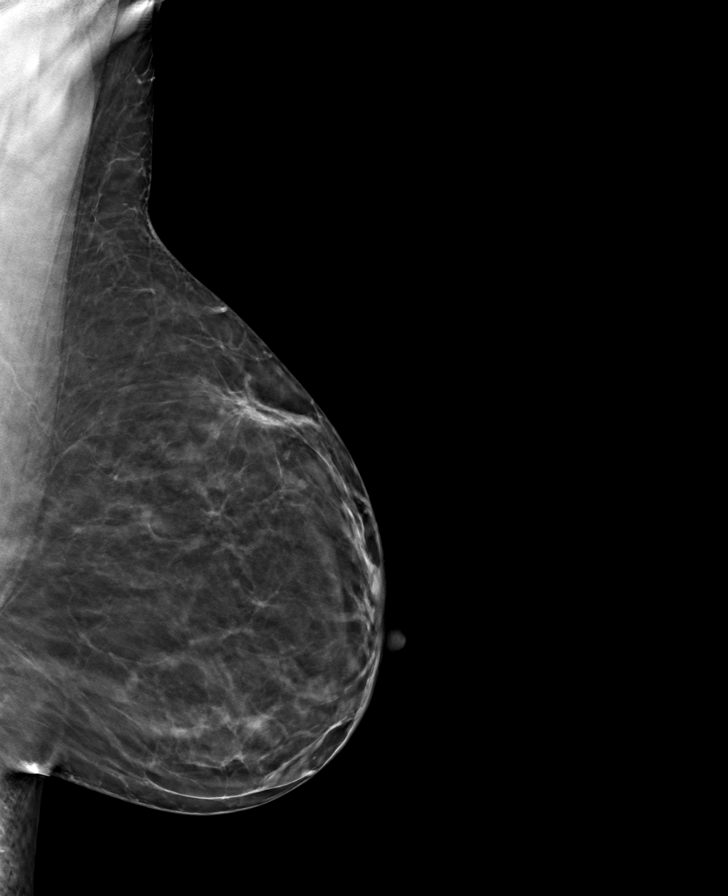

[R MLO tomo · tomo slice 30/59.0]
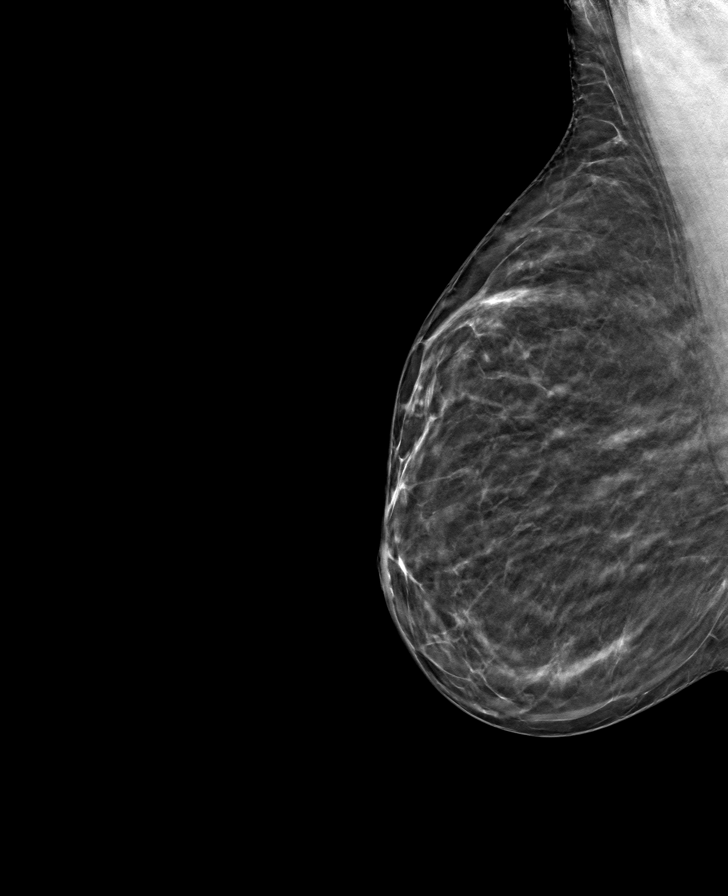

[8 of 24 positions shown; findings below may reference images not displayed]

ACR Breast Density Category b: There are scattered areas of
fibroglandular density.
FINDINGS: There are no findings suspicious for malignancy. Images were
processed with CAD.
IMPRESSION: No mammographic evidence of malignancy. A result letter of this
screening mammogram will be mailed directly to the patient.

RECOMMENDATION:
Screening mammogram in one year. (Code:[id number])

BI-RADS CATEGORY  1: Negative.

## 2023-07-10 DIAGNOSIS — H524 Presbyopia: Secondary | ICD-10-CM | POA: Diagnosis not present

## 2023-07-10 DIAGNOSIS — Z9889 Other specified postprocedural states: Secondary | ICD-10-CM | POA: Diagnosis not present

## 2023-07-10 DIAGNOSIS — H40013 Open angle with borderline findings, low risk, bilateral: Secondary | ICD-10-CM | POA: Diagnosis not present

## 2023-07-10 DIAGNOSIS — Z01419 Encounter for gynecological examination (general) (routine) without abnormal findings: Secondary | ICD-10-CM | POA: Diagnosis not present

## 2023-08-09 ENCOUNTER — Other Ambulatory Visit: Payer: Self-pay | Admitting: Obstetrics and Gynecology

## 2023-08-09 DIAGNOSIS — Z1231 Encounter for screening mammogram for malignant neoplasm of breast: Secondary | ICD-10-CM

## 2023-08-17 DIAGNOSIS — E049 Nontoxic goiter, unspecified: Secondary | ICD-10-CM | POA: Diagnosis not present

## 2023-08-17 DIAGNOSIS — Z130 Encounter for screening for diseases of the blood and blood-forming organs and certain disorders involving the immune mechanism: Secondary | ICD-10-CM | POA: Diagnosis not present

## 2023-08-17 DIAGNOSIS — Z13220 Encounter for screening for lipoid disorders: Secondary | ICD-10-CM | POA: Diagnosis not present

## 2023-08-17 DIAGNOSIS — Z0000 Encounter for general adult medical examination without abnormal findings: Secondary | ICD-10-CM | POA: Diagnosis not present

## 2023-08-17 DIAGNOSIS — Z131 Encounter for screening for diabetes mellitus: Secondary | ICD-10-CM | POA: Diagnosis not present

## 2023-09-04 ENCOUNTER — Telehealth: Payer: Self-pay | Admitting: Family Medicine

## 2023-09-04 ENCOUNTER — Encounter: Payer: Self-pay | Admitting: Family Medicine

## 2023-09-04 ENCOUNTER — Ambulatory Visit: Payer: Commercial Managed Care - PPO | Admitting: Family Medicine

## 2023-09-04 VITALS — BP 122/76 | HR 71 | Temp 97.6°F | Ht 68.0 in | Wt 195.0 lb

## 2023-09-04 DIAGNOSIS — H6121 Impacted cerumen, right ear: Secondary | ICD-10-CM | POA: Diagnosis not present

## 2023-09-04 DIAGNOSIS — F419 Anxiety disorder, unspecified: Secondary | ICD-10-CM | POA: Diagnosis not present

## 2023-09-04 DIAGNOSIS — F17200 Nicotine dependence, unspecified, uncomplicated: Secondary | ICD-10-CM | POA: Diagnosis not present

## 2023-09-04 DIAGNOSIS — E559 Vitamin D deficiency, unspecified: Secondary | ICD-10-CM | POA: Diagnosis not present

## 2023-09-04 DIAGNOSIS — F32A Depression, unspecified: Secondary | ICD-10-CM | POA: Diagnosis not present

## 2023-09-04 NOTE — Telephone Encounter (Signed)
Was able to send fax via EP[facility] for lab results over to Dr. Cherly Hensen

## 2023-09-04 NOTE — Progress Notes (Signed)
New Patient Office Visit  Subjective    Patient ID: Lisa Morrow, female    DOB: Feb 28, 1971  Age: 52 y.o. MRN: [id number]  CC:  Chief Complaint  Patient presents with   Establish Care    Was at OBGYN last month and told her her neck lymph nodes were a little swollen    HPI [name] presents to establish care  OB/GYN- Dr. Amada Jupiter done 2 weeks at OB/GYN office including thyroid tests. She is awaiting the results.   Hx of anxiety and depression. She is using distraction techniques, positive thinking and is managing well.   Buspar gave her headaches in the past.   She has an appt with Dr. Loreta Ave for colon cancer screening.   Mammogram scheduled for tomorrow.   Works at the [employer] person in several areas.   Single. No kids.      09/04/2023    7:59 AM 10/12/2020   11:51 AM  Depression screen PHQ 2/9  Decreased Interest 0 0  Down, Depressed, Hopeless 0 1  PHQ - 2 Score 0 1        Outpatient Encounter Medications as of 09/04/2023  Medication Sig   ibuprofen (ADVIL) 800 MG tablet Take 800 mg by mouth 2 (two) times daily at 10 AM and 5 PM.   OREGANO PO Take by mouth.   [DISCONTINUED] busPIRone (BUSPAR) 7.5 MG tablet Take 1 tablet (7.5 mg total) by mouth 3 (three) times daily. (Patient not taking: No sig reported)   No facility-administered encounter medications on file as of 09/04/2023.    Past Medical History:  Diagnosis Date   Anxiety    Depression    Somewhat   Fibroids    Thyroid disease    ? Labwork drawn by Dr Maxie Better    Past Surgical History:  Procedure Laterality Date   MOUTH SURGERY      Family History  Problem Relation Age of Onset   Breast cancer Maternal Aunt     Social History   Socioeconomic History   Marital status: Single    Spouse name: Not on file   Number of children: Not on file   Years of education: Not on file   Highest education level: Some college, no degree  Occupational  History   Not on file  Tobacco Use   Smoking status: Every Day    Average packs/day: 0.3 packs/day for 25.0 years (6.3 ttl pk-yrs)    Types: Cigarettes    Start date: 10/23/1994   Smokeless tobacco: Never  Substance and Sexual Activity   Alcohol use: Yes    Alcohol/week: 7.0 standard drinks of alcohol    Types: 7 Glasses of wine per week    Comment: 1 to 2 Glasses of wine daily   Drug use: Not Currently   Sexual activity: Not Currently  Other Topics Concern   Not on file  Social History Narrative   Not on file   Social Determinants of Health   Financial Resource Strain: Low Risk  (08/29/2023)   Overall Financial Resource Strain (CARDIA)    Difficulty of Paying Living Expenses: Not hard at all  Food Insecurity: No Food Insecurity (08/29/2023)   Hunger Vital Sign    Worried About Running Out of Food in the Last Year: Never true    Ran Out of Food in the Last Year: Never true  Transportation Needs: No Transportation Needs (08/29/2023)   PRAPARE - Transportation  Lack of Transportation (Medical): No    Lack of Transportation (Non-Medical): No  Physical Activity: Sufficiently Active (08/29/2023)   Exercise Vital Sign    Days of Exercise per Week: 5 days    Minutes of Exercise per Session: 150+ min  Stress: Stress Concern Present (08/29/2023)   [employer] of Occupational Health - Occupational Stress Questionnaire    Feeling of Stress : To some extent  Social Connections: Moderately Isolated (08/29/2023)   Social Connection and Isolation Panel [NHANES]    Frequency of Communication with Friends and Family: More than three times a week    Frequency of Social Gatherings with Friends and Family: More than three times a week    Attends Religious Services: 1 to 4 times per year    Active Member of [employer] or Organizations: No    Attends Engineer, structural: Not on file    Marital Status: Never married  Intimate Partner Violence: Not on file    Review of Systems   Constitutional:  Negative for chills, fever and malaise/fatigue.  HENT:  Positive for hearing loss.        Right ear with hearing loss and fullness sensation  Respiratory:  Negative for cough and shortness of breath.   Cardiovascular:  Negative for chest pain, palpitations and leg swelling.  Gastrointestinal:  Negative for abdominal pain, constipation, diarrhea, nausea and vomiting.  Genitourinary:  Negative for dysuria, frequency and urgency.  Neurological:  Negative for dizziness and focal weakness.  Psychiatric/Behavioral:  Negative for depression. The patient is not nervous/anxious.         Objective    BP 122/76 (BP Location: Left Arm, Patient Position: Sitting, Cuff Size: Large)   Pulse 71   Temp 97.6 F (36.4 C) (Temporal)   Ht 5\' 8"  (1.727 m)   Wt 195 lb (88.5 kg)   SpO2 100%   BMI 29.65 kg/m   Physical Exam Constitutional:      General: She is not in acute distress.    Appearance: She is not ill-appearing.  HENT:     Right Ear: Ear canal and external ear normal.     Left Ear: External ear normal.     Ears:     Comments: Right ear with cerumen impaction and unable to visualize TM pre-lavage. Post lavage, right TM and canal normal.  Left TM with scarring.     Mouth/Throat:     Mouth: Mucous membranes are moist.     Pharynx: Oropharynx is clear.  Eyes:     Extraocular Movements: Extraocular movements intact.     Conjunctiva/sclera: Conjunctivae normal.  Cardiovascular:     Rate and Rhythm: Normal rate and regular rhythm.  Pulmonary:     Effort: Pulmonary effort is normal.     Breath sounds: Normal breath sounds.  Musculoskeletal:     Cervical back: Normal range of motion and neck supple. No tenderness.     Right lower leg: No edema.     Left lower leg: No edema.  Lymphadenopathy:     Cervical: No cervical adenopathy.  Skin:    General: Skin is warm and dry.  Neurological:     General: No focal deficit present.     Mental Status: She is alert and  oriented to person, place, and time.  Psychiatric:        Mood and Affect: Mood normal.        Behavior: Behavior normal.        Thought Content: Thought content normal.  Assessment & Plan:   Problem List Items Addressed This Visit       Other   Vitamin D deficiency   Other Visit Diagnoses     Hearing loss of right ear due to cerumen impaction    -  Primary   Anxiety and depression       Smokes          She is a pleasant 52 year old female who is new to the practice and here to establish care. She is up-to-date with her OB/GYN visit.  In fact, she had blood work done there approximately 2 weeks ago and does not yet have the results.  I asked her to forward these to me when she does. Cerumen impaction of right ear, resolved after ear lavage done by CMA.  She reported improvement in hearing and sensation of fullness. Anxiety and depression-she will continue positive thinking and distraction.  No medications currently since she is doing well. Encouraged smoking cessation. Upcoming colonoscopy for colon cancer screening with Dr. Loreta Ave. Discussed checking on her vaccines to see if they are up-to-date and getting them updated if not.  Return if symptoms worsen or fail to improve.   Hetty Blend, NP-C

## 2023-09-04 NOTE — Telephone Encounter (Signed)
LM informing pt this was sent through [facility] and I will let her know if they need signed release

## 2023-09-04 NOTE — Telephone Encounter (Signed)
Pt called needing a release form to get her blood results from Dr.Cousins to be sent over to [facility]. [facility]. Please advise.

## 2023-09-04 NOTE — Patient Instructions (Signed)
Thank you for trusting [place] with your health care.  Please forward lab results from your OB/GYN office when you have those.  Check on the Shingrix vaccine (2 shot series) and the Tdap vaccine.

## 2023-09-05 ENCOUNTER — Ambulatory Visit
Admission: RE | Admit: 2023-09-05 | Discharge: 2023-09-05 | Disposition: A | Discharge status: Home / Self Care | Payer: Commercial Managed Care - PPO | Source: Ambulatory Visit | Attending: Obstetrics and Gynecology | Admitting: Obstetrics and Gynecology

## 2023-09-05 DIAGNOSIS — Z1231 Encounter for screening mammogram for malignant neoplasm of breast: Secondary | ICD-10-CM

## 2024-06-04 ENCOUNTER — Other Ambulatory Visit: Payer: Self-pay | Admitting: Obstetrics and Gynecology

## 2024-06-04 DIAGNOSIS — Z1231 Encounter for screening mammogram for malignant neoplasm of breast: Secondary | ICD-10-CM

## 2024-07-21 DIAGNOSIS — Z01419 Encounter for gynecological examination (general) (routine) without abnormal findings: Secondary | ICD-10-CM | POA: Diagnosis not present

## 2024-07-21 DIAGNOSIS — Z9889 Other specified postprocedural states: Secondary | ICD-10-CM | POA: Diagnosis not present

## 2024-07-31 ENCOUNTER — Encounter

## 2024-09-05 ENCOUNTER — Ambulatory Visit
Admission: RE | Admit: 2024-09-05 | Discharge: 2024-09-05 | Disposition: A | Discharge status: Home / Self Care | Source: Ambulatory Visit | Attending: Obstetrics and Gynecology | Admitting: Obstetrics and Gynecology

## 2024-09-05 DIAGNOSIS — Z1231 Encounter for screening mammogram for malignant neoplasm of breast: Secondary | ICD-10-CM

## 2024-09-05 HISTORY — DX: Malignant (primary) neoplasm, unspecified: C80.1
